# Patient Record
Sex: Male | Born: 1945
Health system: Southern US, Community
[De-identification: ages and names within clinical notes are randomized; demographics above are authoritative.]

---

## 2016-06-08 ENCOUNTER — Ambulatory Visit (INDEPENDENT_AMBULATORY_CARE_PROVIDER_SITE_OTHER): Payer: Medicare HMO | Admitting: Specialist

## 2016-06-08 ENCOUNTER — Ambulatory Visit (INDEPENDENT_AMBULATORY_CARE_PROVIDER_SITE_OTHER): Payer: Medicare HMO

## 2016-06-08 ENCOUNTER — Encounter (INDEPENDENT_AMBULATORY_CARE_PROVIDER_SITE_OTHER): Payer: Self-pay | Admitting: Specialist

## 2016-06-08 VITALS — BP 141/83 | HR 78 | Ht 72.0 in | Wt 203.0 lb

## 2016-06-08 DIAGNOSIS — M25571 Pain in right ankle and joints of right foot: Secondary | ICD-10-CM | POA: Diagnosis not present

## 2016-06-08 DIAGNOSIS — M19171 Post-traumatic osteoarthritis, right ankle and foot: Secondary | ICD-10-CM | POA: Diagnosis not present

## 2016-06-08 DIAGNOSIS — M79671 Pain in right foot: Secondary | ICD-10-CM | POA: Diagnosis not present

## 2016-06-08 NOTE — Progress Notes (Addendum)
Office Visit Note   Patient: Travis Andrews           Date of Birth: 07/18/1945           MRN: [id number] Visit Date: 06/08/2016              Requested by: No referring provider defined for this encounter. PCP: No primary care provider on file.   Assessment & Plan: Visit Diagnoses:  1. Pain in right ankle and joints of right foot   Travis Andrews 29 years since he injured his right ankle and accident on the job when a portion of machinery dropped onto his right arm and right thigh and right ankle.he underwent ORIF of the right ankle with bone grafting plate and screws to the right fibula and a buttress plate to the right tibia with bone grafting. Eventually went on to heal he reports that he has very little discomfort except when in shoes begin to wear over the posterior and lateral aspectof the heel. He relates that he has placed inserts that he is fabricated himself along the lines of previous shoe inserts from the [employer]. He also has a high instep.I recommended shoes made and manufactured by [employer], thin comfort and [employer] with firmer arch supports that are of longer lasting. Also arch supports from [employer] are not necessarily long lasting are made of aaterial like plastizote.I recommended heel cord stretching exercises and chewing gave him a prescription fora longitudinal arch support His plain radiographs demonstrate post traumatic osteoarthritis anterior aspect of the talotibial joint line.recommended the use of nonsteroidal anti-inflammatory agents like Aleve intermittently heel cord stretching exercises. Be seen back on an as-needed basis.  Plan:Avoid frequent bending and stooping  No lifting greater than 10 lbs. May use ice or moist heat for pain. Weight loss is of benefit.  Knee is suffering from osteoarthritis, only real proven treatments are Weight loss, NSIADs like alleve and exercise. Well padded shoes help. Ice the ankle 2-3 times a day 15-20 mins  Follow-Up  Instructions: No Follow-up on file.   Orders:  Orders Placed This Encounter  Procedures  . XR Ankle Complete Right   No orders of the defined types were placed in this encounter.     Procedures: No procedures performed   Clinical Data: No additional findings.   Subjective: No chief complaint on file.   Travis Andrews states that he is here for his right ankle.  He states that his ankle gets sore and swells sometimes.  He says that his shoes do not support his ankle enough.  He would like some recommendations for shoes or a brace for this.  Am stiffness alittle, not much depends on the previous day. Post a day's work with swelling there is night pain. Uses alleve to help with pain.     Review of Systems  Constitutional: Negative.   HENT: Negative.   Eyes: Negative.   Respiratory: Negative.   Cardiovascular: Negative.   Gastrointestinal: Negative.   Endocrine: Negative.   Genitourinary: Negative.   Musculoskeletal: Negative.   Skin: Negative.   Allergic/Immunologic: Negative.   Neurological: Negative.   Hematological: Negative.   Psychiatric/Behavioral: Negative.      Objective: Vital Signs: There were no vitals taken for this visit.  Physical Exam  Constitutional: He is oriented to person, place, and time. He appears well-developed and well-nourished.  HENT:  Head: Normocephalic and atraumatic.  Eyes: EOM are normal. Pupils are equal, round, and reactive to light.  Neck: Normal range of motion. Neck supple.  Pulmonary/Chest: Effort normal and breath sounds normal.  Abdominal: Soft. Bowel sounds are normal.  Neurological: He is alert and oriented to person, place, and time.  Skin: Skin is warm and dry.  Psychiatric: He has a normal mood and affect. His behavior is normal. Judgment and thought content normal.    Right Ankle Exam   Range of Motion  Dorsiflexion: abnormal  Plantar flexion: normal  Inversion: abnormal  Eversion: abnormal   Muscle Strength   Dorsiflexion:  5/5 Plantar flexion:  5/5 Anterior tibial:  5/5 Posterior tibial:  5/5 Gastrocsoleus:  5/5 Peroneal muscle:  5/5  Tests  Anterior drawer: positive Varus tilt: positive  Other  Erythema: absent Scars: absent Sensation: normal Pulse: present    Back Exam   Tenderness  The patient is experiencing tenderness in the lumbar.  Range of Motion  Extension: normal  Flexion: normal  Lateral Bend Right: normal  Lateral Bend Left: normal  Rotation Right: normal  Rotation Left: normal       Specialty Comments:  No specialty comments available.  Imaging: No results found.   PMFS History: There are no active problems to display for this patient.  No past medical history on file.  No family history on file.  No past surgical history on file. Social History   Occupational History  . Not on file.   Social History Main Topics  . Smoking status: Not on file  . Smokeless tobacco: Not on file  . Alcohol use Not on file  . Drug use: Unknown  . Sexual activity: Not on file

## 2016-06-08 NOTE — Patient Instructions (Signed)
Avoid frequent bending and stooping  No lifting greater than 10 lbs. May use ice or moist heat for pain. Weight loss is of benefit.  Knee is suffering from osteoarthritis, only real proven treatments are Weight loss, NSIADs like alleve and exercise. Well padded shoes help. Ice the ankle 2-3 times a day 15-20 mins at a time.

## 2016-08-01 ENCOUNTER — Other Ambulatory Visit: Payer: Self-pay | Admitting: Internal Medicine

## 2016-08-01 DIAGNOSIS — R42 Dizziness and giddiness: Secondary | ICD-10-CM

## 2016-08-02 ENCOUNTER — Ambulatory Visit
Admission: RE | Admit: 2016-08-02 | Discharge: 2016-08-02 | Disposition: A | Discharge status: Home / Self Care | Payer: Medicare HMO | Source: Ambulatory Visit | Attending: Internal Medicine | Admitting: Internal Medicine

## 2016-08-02 DIAGNOSIS — R42 Dizziness and giddiness: Secondary | ICD-10-CM

## 2018-05-23 IMAGING — CT CT HEAD W/O CM
3 of 4 series · 16 of 47 positions shown, 19 images · non-contrast
Comparison: None.

CLINICAL DATA: 70-year-old male with positional vertigo last 3
days. Confusion. Initial encounter.

EXAM:
CT HEAD WITHOUT CONTRAST
TECHNIQUE: Contiguous axial images were obtained from the base of the skull
through the vertex without intravenous contrast.

[Series 32: 3d filtered head w/o · axial · non-contrast · 0.49mm/px · z∈[-50,+100]mm · 10 of 36 slices shown, 13 images]
[im 3/36  brain]
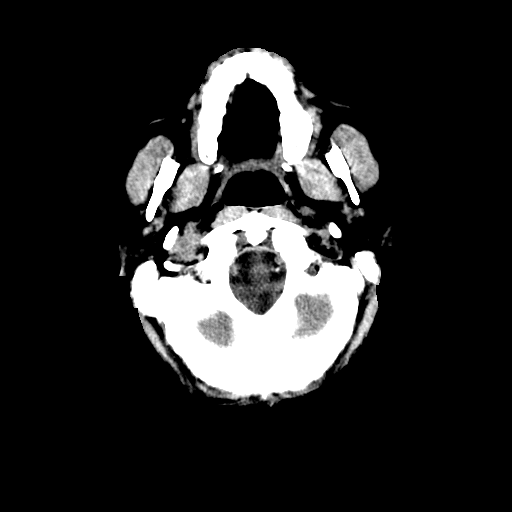
[im 3/36  bone]
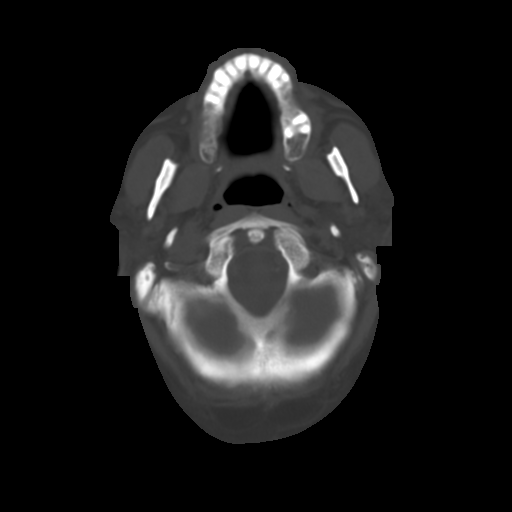
[im 6/36  brain]
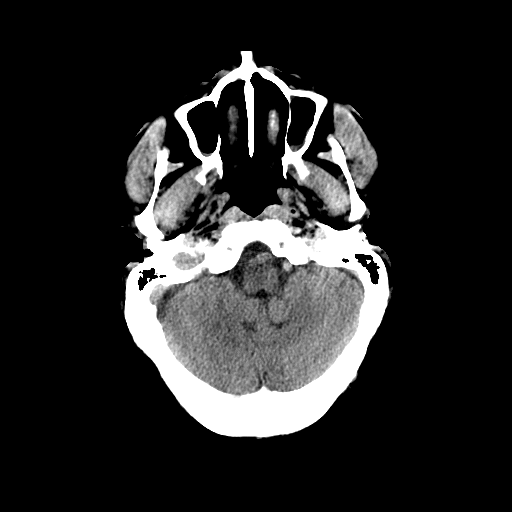
[im 11/36  brain]
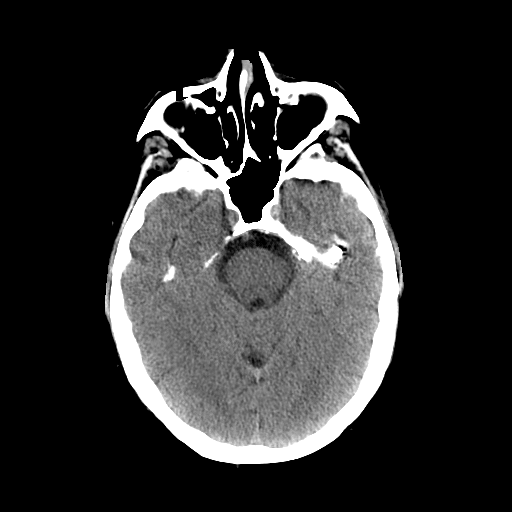
[im 13/36  brain]
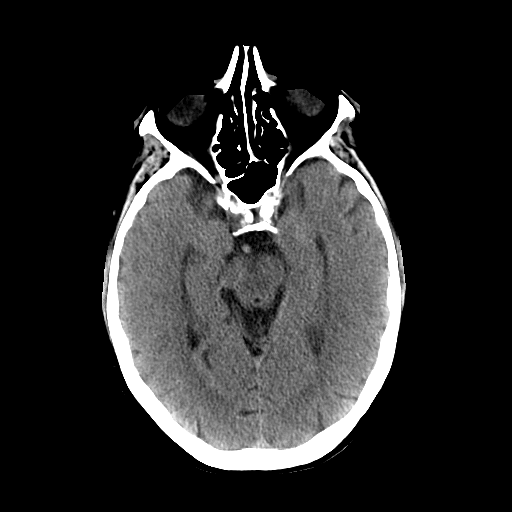
[im 16/36  brain]
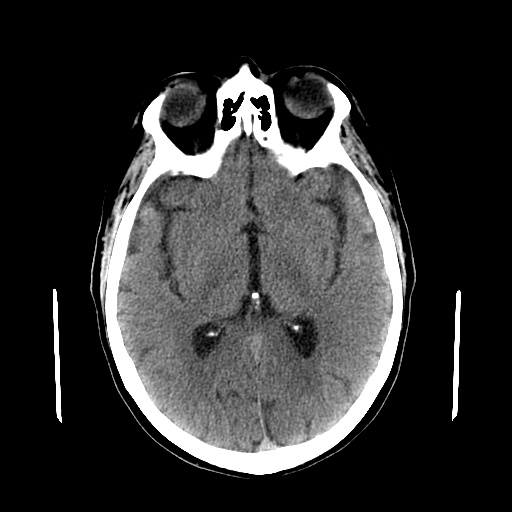
[im 16/36  bone]
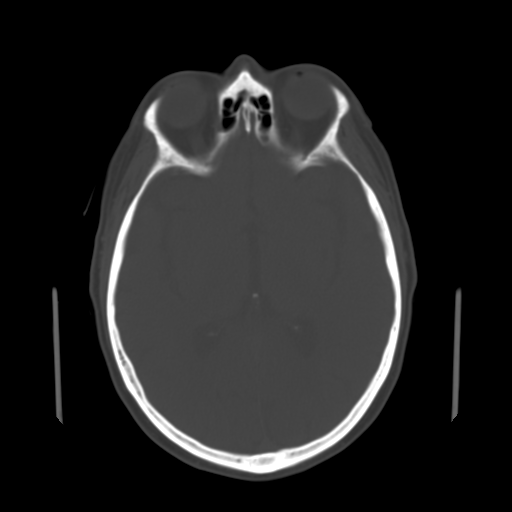
[im 21/36  brain]
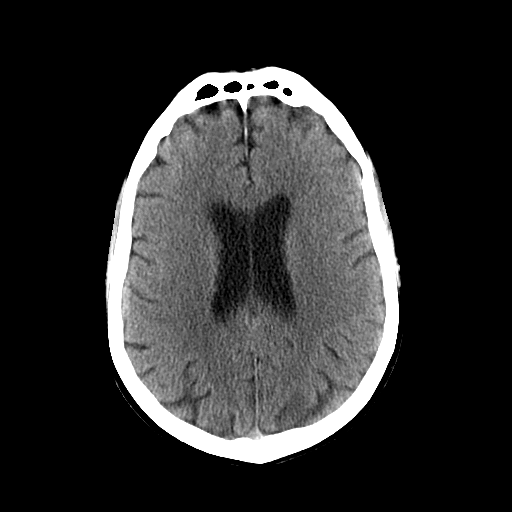
[im 23/36  brain]
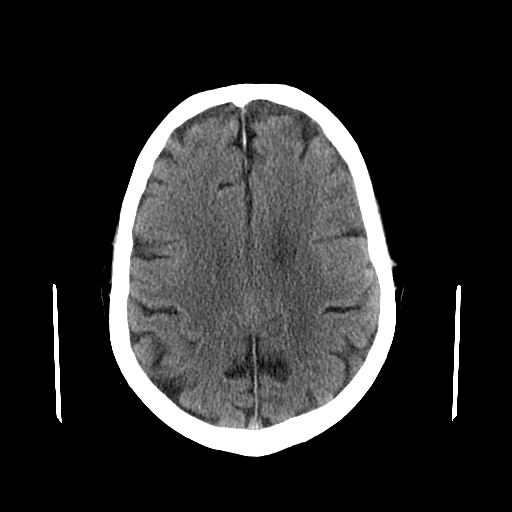
[im 26/36  brain]
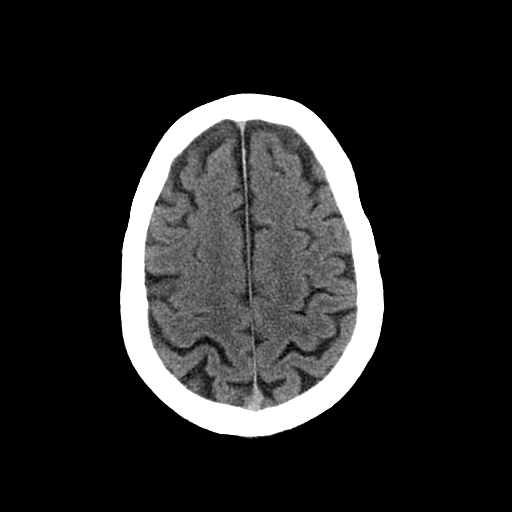
[im 31/36  brain]
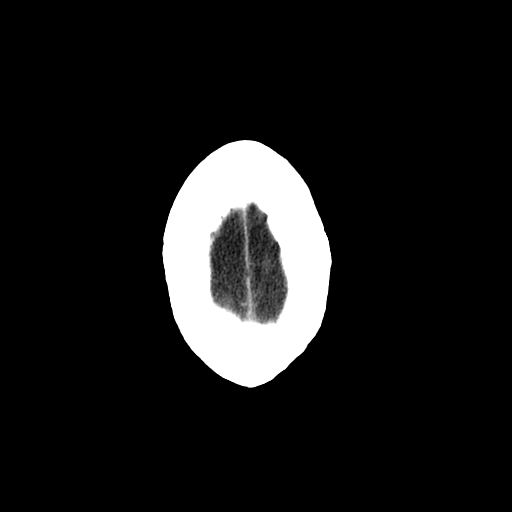
[im 31/36  bone]
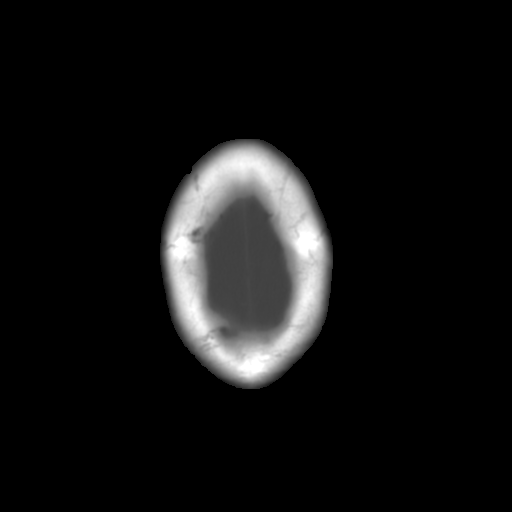
[im 33/36  brain]
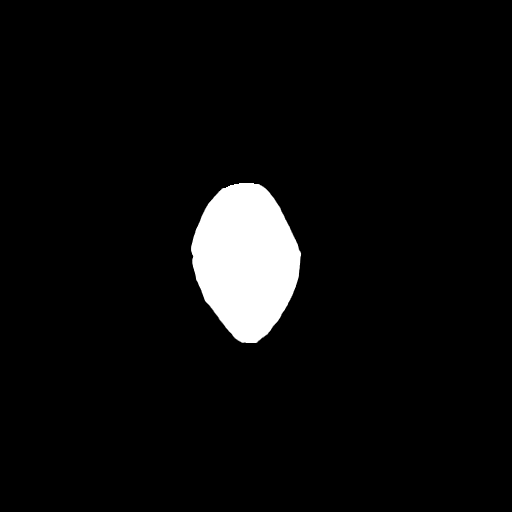

[Series 601: coronal brain · coronal · 0.49mm/px · 3 of 79 slices shown]
[im 27/79  brain]
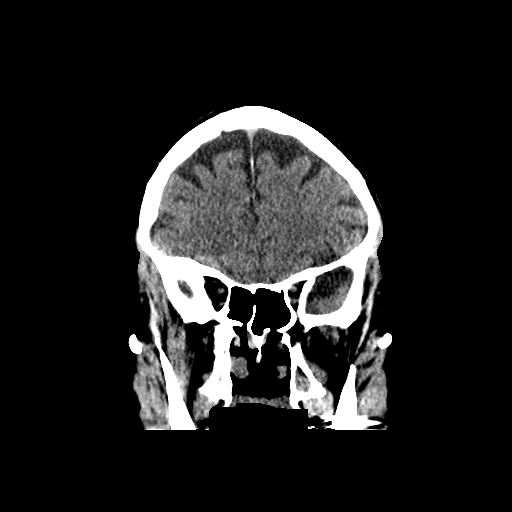
[im 35/79  brain]
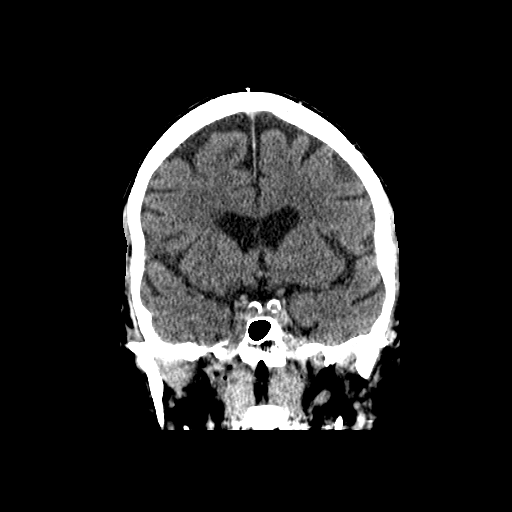
[im 44/79  brain]
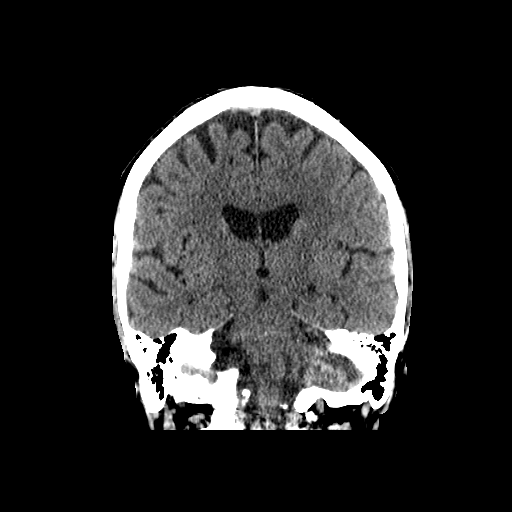

[Series 602: sagittal brain · sagittal · 0.49mm/px · 3 of 60 slices shown]
[im 20/60  brain]
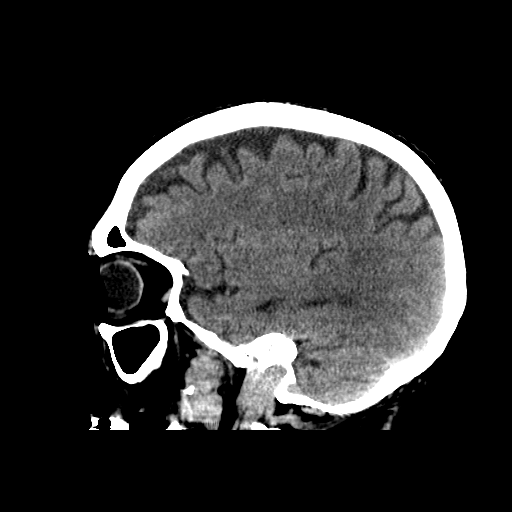
[im 30/60  brain]
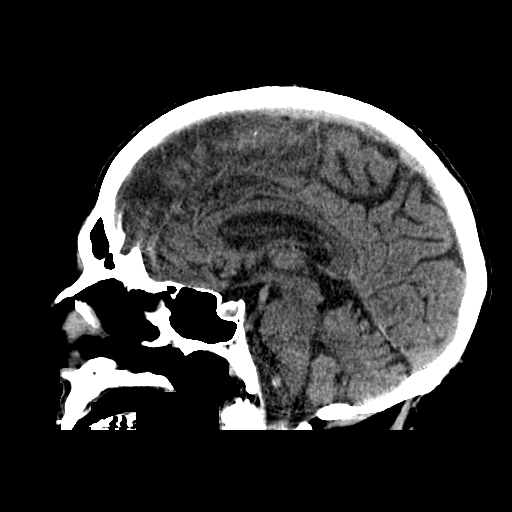
[im 40/60  brain]
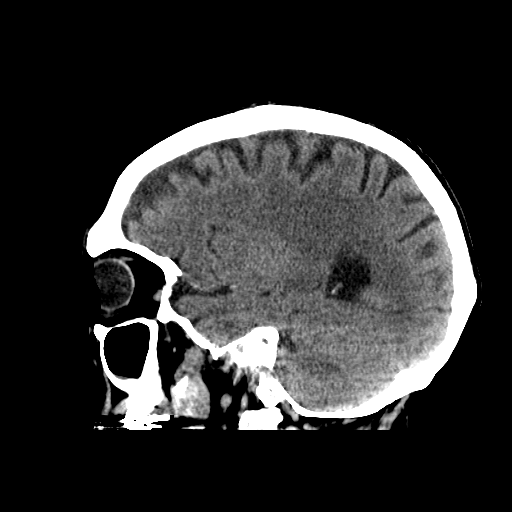

[16 of 47 positions shown; findings below may reference images not displayed]

FINDINGS: Brain: No intracranial hemorrhage or CT evidence of large acute
infarct.

Chronic microvascular changes.

Mild atrophy typical of age without hydrocephalus.

No intracranial mass lesion noted on this unenhanced exam.

Vascular: Fusiform dilated internal carotid artery cavernous segment
greater on the right. Prominent calcification vertebral arteries.

Skull: Negative

Sinuses/Orbits: Post lens replacement without acute orbital
abnormality.

Mild mucosal thickening maxillary sinuses.

Mastoid air cells and middle ear cavities appear clear. No obvious
abnormality of the labyrinthine structures although thin-section
imaging not performed.

Other: Negative
IMPRESSION: No intracranial hemorrhage or CT evidence of acute infarct.

Mild to moderate chronic microvascular changes.

Prominent vascular calcifications with fusiform dilated internal
carotid artery cavernous segment greater on the right. Prominent
vertebral artery calcification.

Mild mucosal thickening maxillary sinuses.

## 2018-09-03 DIAGNOSIS — H401313 Pigmentary glaucoma, right eye, severe stage: Secondary | ICD-10-CM | POA: Diagnosis not present

## 2018-09-03 DIAGNOSIS — H401321 Pigmentary glaucoma, left eye, mild stage: Secondary | ICD-10-CM | POA: Diagnosis not present

## 2018-09-12 DIAGNOSIS — M2012 Hallux valgus (acquired), left foot: Secondary | ICD-10-CM | POA: Diagnosis not present

## 2018-09-12 DIAGNOSIS — M19071 Primary osteoarthritis, right ankle and foot: Secondary | ICD-10-CM | POA: Diagnosis not present

## 2018-09-12 DIAGNOSIS — M7731 Calcaneal spur, right foot: Secondary | ICD-10-CM | POA: Diagnosis not present

## 2018-09-12 DIAGNOSIS — M25571 Pain in right ankle and joints of right foot: Secondary | ICD-10-CM | POA: Diagnosis not present

## 2018-09-12 DIAGNOSIS — M7732 Calcaneal spur, left foot: Secondary | ICD-10-CM | POA: Diagnosis not present

## 2018-10-17 DIAGNOSIS — H6123 Impacted cerumen, bilateral: Secondary | ICD-10-CM | POA: Diagnosis not present

## 2018-10-22 DIAGNOSIS — M2012 Hallux valgus (acquired), left foot: Secondary | ICD-10-CM | POA: Diagnosis not present

## 2018-10-22 DIAGNOSIS — M19071 Primary osteoarthritis, right ankle and foot: Secondary | ICD-10-CM | POA: Diagnosis not present

## 2018-10-22 DIAGNOSIS — M19072 Primary osteoarthritis, left ankle and foot: Secondary | ICD-10-CM | POA: Diagnosis not present

## 2018-10-31 DIAGNOSIS — M21371 Foot drop, right foot: Secondary | ICD-10-CM | POA: Diagnosis not present

## 2018-12-10 DIAGNOSIS — H401313 Pigmentary glaucoma, right eye, severe stage: Secondary | ICD-10-CM | POA: Diagnosis not present

## 2018-12-10 DIAGNOSIS — H401321 Pigmentary glaucoma, left eye, mild stage: Secondary | ICD-10-CM | POA: Diagnosis not present

## 2019-02-14 DIAGNOSIS — H401321 Pigmentary glaucoma, left eye, mild stage: Secondary | ICD-10-CM | POA: Diagnosis not present

## 2019-02-14 DIAGNOSIS — H401313 Pigmentary glaucoma, right eye, severe stage: Secondary | ICD-10-CM | POA: Diagnosis not present

## 2019-02-26 DIAGNOSIS — L309 Dermatitis, unspecified: Secondary | ICD-10-CM | POA: Diagnosis not present

## 2019-02-26 DIAGNOSIS — J301 Allergic rhinitis due to pollen: Secondary | ICD-10-CM | POA: Diagnosis not present

## 2019-02-26 DIAGNOSIS — J3089 Other allergic rhinitis: Secondary | ICD-10-CM | POA: Diagnosis not present

## 2019-02-26 DIAGNOSIS — T781XXD Other adverse food reactions, not elsewhere classified, subsequent encounter: Secondary | ICD-10-CM | POA: Diagnosis not present

## 2019-03-13 DIAGNOSIS — H6123 Impacted cerumen, bilateral: Secondary | ICD-10-CM | POA: Diagnosis not present

## 2019-07-29 DIAGNOSIS — H6123 Impacted cerumen, bilateral: Secondary | ICD-10-CM | POA: Diagnosis not present

## 2019-08-12 DIAGNOSIS — H401313 Pigmentary glaucoma, right eye, severe stage: Secondary | ICD-10-CM | POA: Diagnosis not present

## 2019-08-12 DIAGNOSIS — H401321 Pigmentary glaucoma, left eye, mild stage: Secondary | ICD-10-CM | POA: Diagnosis not present

## 2019-10-31 DIAGNOSIS — H04123 Dry eye syndrome of bilateral lacrimal glands: Secondary | ICD-10-CM | POA: Diagnosis not present

## 2019-10-31 DIAGNOSIS — H401331 Pigmentary glaucoma, bilateral, mild stage: Secondary | ICD-10-CM | POA: Diagnosis not present
